# Patient Record
Sex: Male | Born: 1970 | Race: White | Hispanic: No | Marital: Single | Smoking: Current every day smoker
Health system: Southern US, Community
[De-identification: ages and names within clinical notes are randomized; demographics above are authoritative.]

## PROBLEM LIST (undated history)

## (undated) DIAGNOSIS — I10 Essential (primary) hypertension: Secondary | ICD-10-CM

## (undated) DIAGNOSIS — E785 Hyperlipidemia, unspecified: Secondary | ICD-10-CM

## (undated) HISTORY — DX: Hyperlipidemia, unspecified: E78.5

## (undated) HISTORY — DX: Essential (primary) hypertension: I10

---

## 2004-11-19 ENCOUNTER — Emergency Department (HOSPITAL_COMMUNITY): Admission: EM | Admit: 2004-11-19 | Discharge: 2004-11-19 | Payer: Self-pay | Admitting: Emergency Medicine

## 2007-01-12 ENCOUNTER — Encounter: Admission: RE | Admit: 2007-01-12 | Discharge: 2007-01-12 | Payer: Self-pay | Admitting: Family Medicine

## 2007-10-04 ENCOUNTER — Ambulatory Visit (HOSPITAL_BASED_OUTPATIENT_CLINIC_OR_DEPARTMENT_OTHER): Admission: RE | Admit: 2007-10-04 | Discharge: 2007-10-04 | Payer: Self-pay | Admitting: Urology

## 2007-10-04 ENCOUNTER — Encounter (INDEPENDENT_AMBULATORY_CARE_PROVIDER_SITE_OTHER): Payer: Self-pay | Admitting: Urology

## 2008-06-03 ENCOUNTER — Emergency Department (HOSPITAL_COMMUNITY): Admission: EM | Admit: 2008-06-03 | Discharge: 2008-06-03 | Payer: Self-pay | Admitting: Emergency Medicine

## 2010-09-27 ENCOUNTER — Ambulatory Visit (HOSPITAL_COMMUNITY)
Admission: RE | Admit: 2010-09-27 | Discharge: 2010-09-27 | Payer: Self-pay | Source: Home / Self Care | Admitting: Specialist

## 2011-04-15 NOTE — Op Note (Signed)
NAME:  Jeremy Hutchinson, Jeremy Hutchinson               ACCOUNT NO.:  [id number]   MEDICAL RECORD NO.:  [id number]          PATIENT TYPE:  AMB   LOCATION:  [facility]                         FACILITY:  [facility]   PHYSICIAN:  Maretta Bees. Vonita Moss, M.D.DATE OF BIRTH:  1970-12-31   DATE OF PROCEDURE:  10/04/2007  DATE OF DISCHARGE:                               OPERATIVE REPORT   PREOPERATIVE DIAGNOSIS:  Condyloma of penis, scrotum and thighs.   POSTOPERATIVE DIAGNOSIS:  Condyloma of penis, scrotum and thighs.   PROCEDURE:  Carbon dioxide laser of warts on penis, scrotum, and inner  thighs.   SURGEON:  Maretta Bees. Vonita Moss, M.D.   ANESTHESIA:  General.   INDICATIONS:  This 40 year old gentleman sent for evaluation for  treatment of multiple penile and scrotal and thigh lesions that are  typical of condyloma.  He was counseled about therapies including laser  therapy which I felt had the most likely chance of a long-term cure.  He  was advised about postop pain and lesions that will need to be treated  with Neosporin until they heal up.   PROCEDURE:  The patient was brought to the operating room and placed in  the lithotomy position.  External genitalia and inner thighs were  prepped with Hibiclens.  Wet towels were placed around the perioperative  area to protect the surrounding area from the laser beam.  The usual  laser precautions were utilized.  The carbon dioxide laser was set at 5  watts of power, and multiple penile warts, especially on the distal  dorsum of the penis, were treated with the CO2 laser.  A small scrotal  wart was treated, a suprapubic wart was treated, and one lesion on each  inner thigh was actually excised by using the CO2 laser aimed at the  base of the lesion.  At this point, all visible condyloma were treated,  and the wounds were covered with Neosporin.   He was taken to the recovery room in good condition.      Maretta Bees. Vonita Moss, M.D.  Electronically Signed     LJP/MEDQ  D:   10/04/2007  T:  10/04/2007  Job:  [id number]   cc:   Marjory Lies, M.D.  Fax: [phone]

## 2011-09-09 LAB — POCT I-STAT 4, (NA,K, GLUC, HGB,HCT)
Hemoglobin: 15
Operator id: 133231
Sodium: 138

## 2012-07-05 IMAGING — CR DG ORBITS FOR FOREIGN BODY
2 series · 2 of 2 positions shown · non-contrast
Comparison: None

CLINICAL DATA: Metal working/exposure; clearance prior to MRI

ORBITS FOR FOREIGN BODY - 2 VIEW

[view not recorded (1 of 2)]
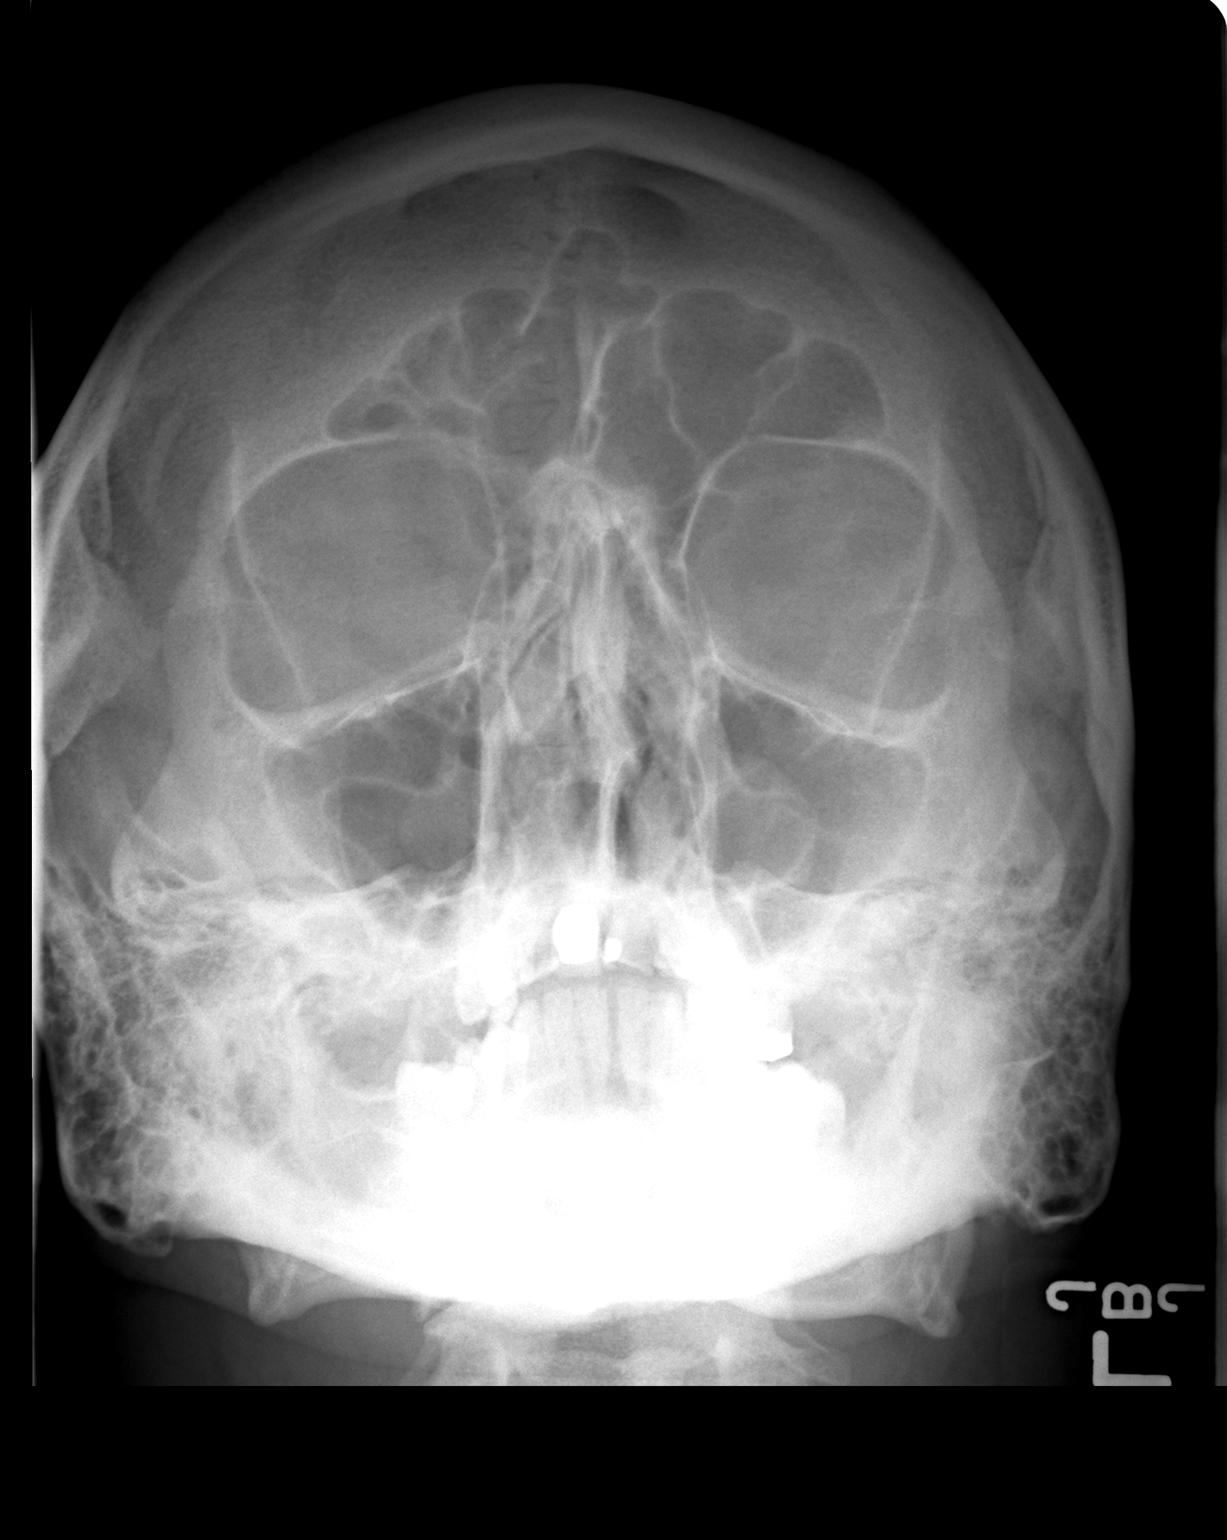

[view not recorded (2 of 2)]
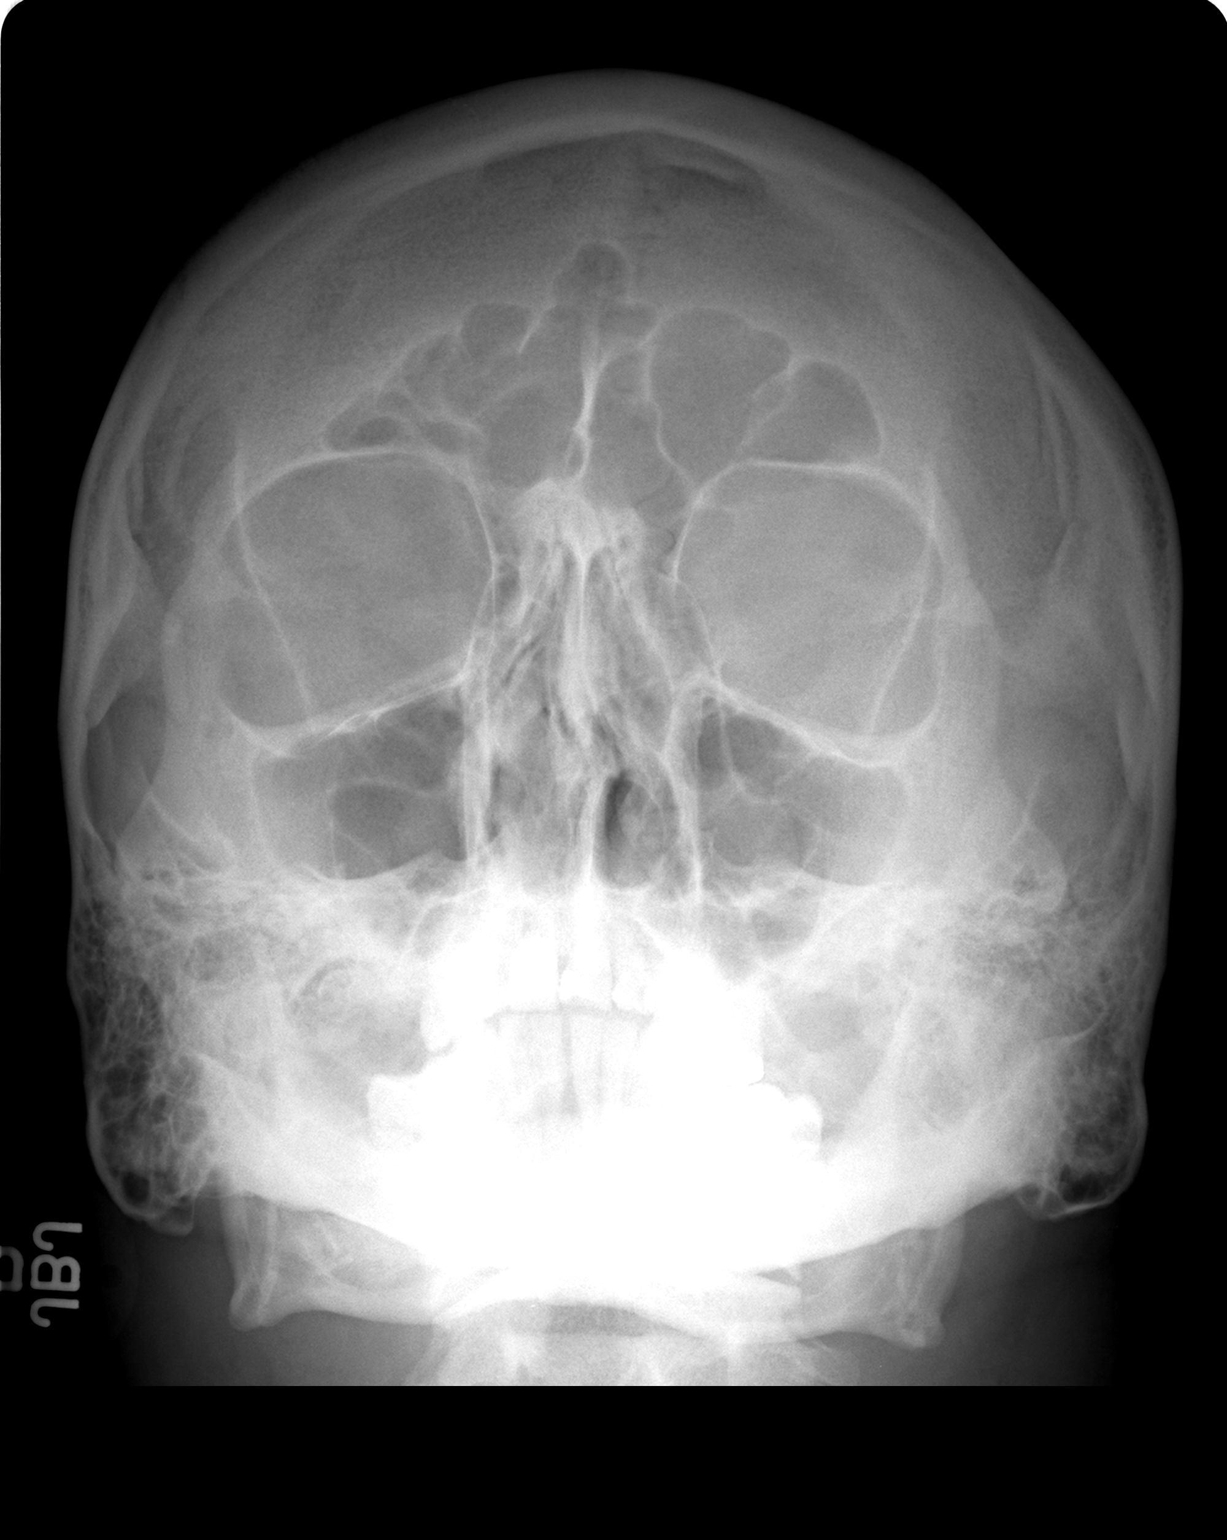

[2 of 2 positions shown; findings below may reference images not displayed]

FINDINGS: There is no evidence of metallic foreign body within the
orbits.  No significant bone abnormality identified.
IMPRESSION: No evidence of metallic foreign body within the orbits.

## 2016-10-08 ENCOUNTER — Encounter (HOSPITAL_COMMUNITY): Payer: Self-pay | Admitting: Family Medicine

## 2016-10-08 ENCOUNTER — Other Ambulatory Visit (HOSPITAL_COMMUNITY): Payer: Self-pay | Admitting: Family Medicine

## 2016-10-08 ENCOUNTER — Ambulatory Visit (HOSPITAL_COMMUNITY)
Admission: RE | Admit: 2016-10-08 | Discharge: 2016-10-08 | Disposition: A | Discharge status: Home / Self Care | Payer: 59 | Source: Ambulatory Visit | Attending: Nurse Practitioner | Admitting: Nurse Practitioner

## 2016-10-08 DIAGNOSIS — M79661 Pain in right lower leg: Secondary | ICD-10-CM | POA: Diagnosis present

## 2016-10-08 DIAGNOSIS — M7989 Other specified soft tissue disorders: Secondary | ICD-10-CM | POA: Diagnosis not present

## 2016-10-08 NOTE — Progress Notes (Signed)
VASCULAR LAB PRELIMINARY  PRELIMINARY  PRELIMINARY  PRELIMINARY  Right lower extremity venous duplex completed.    Preliminary report:  Right:  No evidence of DVT, superficial thrombosis, or Baker's cyst.  Ondrea Dow, RVS 10/08/2016, 2:27 PM

## 2019-09-22 ENCOUNTER — Ambulatory Visit (INDEPENDENT_AMBULATORY_CARE_PROVIDER_SITE_OTHER): Payer: 59 | Admitting: Cardiology

## 2019-09-22 ENCOUNTER — Encounter: Payer: Self-pay | Admitting: Cardiology

## 2019-09-22 ENCOUNTER — Other Ambulatory Visit: Payer: Self-pay

## 2019-09-22 VITALS — BP 136/90 | HR 75 | Temp 96.9°F | Ht 71.0 in | Wt 283.2 lb

## 2019-09-22 DIAGNOSIS — F17200 Nicotine dependence, unspecified, uncomplicated: Secondary | ICD-10-CM

## 2019-09-22 DIAGNOSIS — R739 Hyperglycemia, unspecified: Secondary | ICD-10-CM

## 2019-09-22 DIAGNOSIS — I10 Essential (primary) hypertension: Secondary | ICD-10-CM

## 2019-09-22 DIAGNOSIS — E782 Mixed hyperlipidemia: Secondary | ICD-10-CM

## 2019-09-22 DIAGNOSIS — R002 Palpitations: Secondary | ICD-10-CM | POA: Diagnosis not present

## 2019-09-22 MED ORDER — DILTIAZEM HCL ER COATED BEADS 240 MG PO CP24: 240.0000 mg | ORAL_CAPSULE | Freq: Every day | ORAL | 2 refills | Status: DC

## 2019-09-22 NOTE — Progress Notes (Signed)
Primary Physician/Referring:  Aletha Halim., PA-C  Patient ID: Jeremy Hutchinson, male    DOB: 01/12/1971, 48 y.o.   MRN: [id number]  Chief Complaint  Patient presents with  . New Patient (Initial Visit)  . Palpitations   HPI:    Jeremy Hutchinson  is a 48 y.o. Caucasian male patient referred to me for evaluation of palpitations, that started about 2 months ago.  Described as very brief, happening mostly during routine activities.  No other associated symptoms sometimes symptoms feel like my chest pain again lasting seconds.  Jeremy Hutchinson continues to exercise regularly both on the treadmill and weightlifting without any limitations.  Past medical history significant for hypertension, hyperlipidemia and tobacco use disorder, smokes 1 ppd.   Past Medical History:  Diagnosis Date  . Hyperlipidemia   . Hypertension    Past Surgical History:  Procedure Laterality Date  . ANKLE SURGERY     7-8 yrs ago   Social History   Socioeconomic History  . Marital status: Single    Spouse name: Not on file  . Number of children: Not on file  . Years of education: Not on file  . Highest education level: Not on file  Occupational History  . Not on file  Social Needs  . Financial resource strain: Not on file  . Food insecurity    Worry: Not on file    Inability: Not on file  . Transportation needs    Medical: Not on file    Non-medical: Not on file  Tobacco Use  . Smoking status: Current Every Day Smoker    Packs/day: 1.00    Types: Cigarettes  . Smokeless tobacco: Never Used  Substance and Sexual Activity  . Alcohol use: Yes    Comment: beer weekend  . Drug use: Not on file  . Sexual activity: Not on file  Lifestyle  . Physical activity    Days per week: Not on file    Minutes per session: Not on file  . Stress: Not on file  Relationships  . Social Herbalist on phone: Not on file    Gets together: Not on file    Attends religious service: Not on file    Active member of  club or organization: Not on file    Attends meetings of clubs or organizations: Not on file    Relationship status: Not on file  . Intimate partner violence    Fear of current or ex partner: Not on file    Emotionally abused: Not on file    Physically abused: Not on file    Forced sexual activity: Not on file  Other Topics Concern  . Not on file  Social History Narrative  . Not on file   ROS  Review of Systems  Constitution: Negative for chills, decreased appetite, malaise/fatigue and weight gain.  Cardiovascular: Positive for irregular heartbeat and palpitations. Negative for dyspnea on exertion, leg swelling and syncope.  Respiratory: Negative for snoring.   Endocrine: Negative for cold intolerance.  Hematologic/Lymphatic: Does not bruise/bleed easily.  Musculoskeletal: Negative for joint swelling.  Gastrointestinal: Negative for abdominal pain, anorexia, change in bowel habit, hematochezia and melena.  Neurological: Negative for headaches and light-headedness.  Psychiatric/Behavioral: Negative for depression and substance abuse.  All other systems reviewed and are negative.  Objective   Vitals with BMI 09/22/2019  Height 5\' 11"   Weight 283 lbs 3 oz  BMI 97.98  Systolic 921  Diastolic 90  Pulse  75    Blood pressure 136/90, pulse 75, temperature (!) 96.9 F (36.1 C), height 5\' 11"  (1.803 m), weight 283 lb 3.2 oz (128.5 kg), SpO2 97 %. Body mass index is 39.5 kg/m.   Physical Exam  Constitutional:  Well built and nearly morbidly obese in no acute distress  HENT:  Head: Atraumatic.  Eyes: Conjunctivae are normal.  Neck: Neck supple. No JVD present. No thyromegaly present.  Cardiovascular: Normal rate, regular rhythm, normal heart sounds and intact distal pulses. Exam reveals no gallop.  No murmur heard. No leg edema, no JVD.  Pulmonary/Chest: Effort normal and breath sounds normal.  Abdominal: Soft. Bowel sounds are normal.  Obese  Musculoskeletal: Normal range of  motion.  Neurological: Jeremy Hutchinson is alert.  Skin: Skin is warm and dry.  Psychiatric: Jeremy Hutchinson has a normal mood and affect.    Laboratory examination:   Hemoglobin A1CResulted: 08/03/2019 6:44 PM [facility] Component Name Value Ref Range  HEMOGLOBIN A1C 5.7 (H)  Comment: Normal:    Less than 5.7% Prediabetes: 5.7% to 6.4% Diabetes:   Greater than 6.4% <5.7 %  eAG 117 MG/DL  Lipid ProfileResulted: 08/03/2019 6:22 PM [facility] Component Name Value Ref Range  LDL Direct 152 (H) <130 mg/dL  Total Cholesterol 409229 (H) 25 - 199 MG/DL  Triglycerides 811574 (H) 10 - 150 MG/DL  HDL Cholesterol 37 35 - 135 MG/DL  Total Chol / HDL Cholesterol 6.2 (H) <4.5   Non-HDL Cholesterol 192  Comment:  TARGET: <(LDL-C TARGET + 30)MG/DL MG/DL    Comprehensive Metabolic PanelResulted: 08/03/2019 6:22 PM [facility] Component Name Value Ref Range  Sodium 137 135 - 146 MMOL/L  Potassium 4.0 3.5 - 5.3 MMOL/L  Chloride 104 98 - 110 MMOL/L  CO2 26 23 - 30 MMOL/L  BUN 11 8 - 24 MG/DL  Glucose 97 70 - 99 MG/DL  Creatinine 9.140.80 0.5 - 1.5 MG/DL  Calcium 9.5 8.5 - 78.210.5 MG/DL  Total Protein 7.0 6 - 8.3 G/DL  Albumin  4.4 3.5 - 5 G/DL  Total Bilirubin 0.4 0.1 - 1.2 MG/DL  Alkaline Phosphatase 47 25 - 125 IU/L or U/L  AST (SGOT) 23 5 - 40 IU/L or U/L  ALT (SGPT) 41 5 - 50 IU/L or U/L  Anion Gap 8 4 - 14 MMOL/L   CBC normal.   No results for input(s): NA, K, CL, CO2, GLUCOSE, BUN, CREATININE, CALCIUM, GFRNONAA, GFRAA in the last 8760 hours. CMP 10/04/2007  Glucose 114(H)  Sodium 138  Potassium 3.9   CBC 10/04/2007  Hemoglobin 15.0  Hematocrit 44.0   Lipid Panel  No results found for: CHOL, TRIG, HDL, CHOLHDL, VLDL, LDLCALC, LDLDIRECT HEMOGLOBIN A1C No results found for: HGBA1C, MPG TSH No results for input(s): TSH in the last 8760 hours. Medications and allergies  No Known Allergies   Prior to Admission medications   Medication  Sig Start Date End Date Taking? Authorizing Provider  atorvastatin (LIPITOR) 40 MG tablet Take by mouth daily. 08/03/19  Yes [provider]  Cholecalciferol (VITAMIN D3) 125 MCG (5000 UT) TABS Take by mouth daily.   Yes [provider]  lisinopril-hydrochlorothiazide (ZESTORETIC) 20-25 MG tablet Take by mouth daily. 09/08/19  Yes [provider]  Omega-3 Fatty Acids (OMEGA-3 EPA FISH OIL PO) Take by mouth. 1500-300mg  bid   Yes [provider]     Current Outpatient Medications  Medication Instructions  . atorvastatin (LIPITOR) 40 MG tablet Oral, Daily  .  Cholecalciferol (VITAMIN D3) 125 MCG (5000 UT) TABS Oral, Daily  . diltiazem (CARDIZEM CD) 240 mg, Oral, Daily  . lisinopril-hydrochlorothiazide (ZESTORETIC) 20-25 MG tablet Oral, Daily  . Omega-3 Fatty Acids (OMEGA-3 EPA FISH OIL PO) Oral, 1500-300mg  bid     Radiology:  No results found.   Cardiac Studies:   None  Assessment     ICD-10-CM   1. Palpitations  R00.2 EKG 12-Lead    PCV CARDIAC STRESS TEST    PCV ECHOCARDIOGRAM COMPLETE  2. Essential hypertension  I10 PCV CARDIAC STRESS TEST    PCV ECHOCARDIOGRAM COMPLETE  3. Mixed hyperlipidemia  E78.2   4. Hyperglycemia  R73.9   5. Tobacco use disorder  F17.200     EKG 09/22/2019: Normal sinus rhythm at rate of 71 bpm, normal axis.  No evidence of ischemia, normal EKG.  Recommendations:    Mrs. Jamar is a Caucasian male with hypertension, mixed hyperlipidemia, hyperglycemia, obesity, who still exercises fairly rigorously, referred to me for evaluation of palpitations that started about 2 months ago and also has sharp chest pains associated with this.  Mostly noticed symptoms during rest and routine activities done with exertional activities.  Symptoms are more indicated to of PACs and PVCs.  Do not think that Jeremy Hutchinson has atrial fibrillation although Jeremy Hutchinson is at risk for the same.  Jeremy Hutchinson blood pressure is elevated today, also in view of palpitations, I  have added diltiazem CD 140 mg q. daily.  I reviewed Jeremy Hutchinson medical records, had a long discussion with him regarding hyper lipidemia especially mixed hyperlipidemia and Jeremy Hutchinson poor eating habits.  Although Jeremy Hutchinson states that Jeremy Hutchinson eats healthy only since Jeremy Hutchinson recently saw Jeremy Hutchinson PCP and was found to have marked elevation in lipids and hypertension has made changes to Jeremy Hutchinson diet.  Jeremy Hutchinson was on statin before but Jeremy Hutchinson had discontinued this and Jeremy Hutchinson is now back on it.  I did not make any changes to this.  I'll set him up for a routine treadmill excess stress test and an echocardiogram and see him back after this. I reviewed the labs from PCP.  Jeremy Hutchinson has one pack of cigarette smoking a day, I have discussed regarding smoking cessation.  Yates Decamp, MD, [facility] 09/24/2019, 6:07 PM [facility]. [place] Pager: [phone] Office: [phone] If no answer Cell [phone]

## 2019-09-30 ENCOUNTER — Other Ambulatory Visit: Payer: Self-pay

## 2019-09-30 ENCOUNTER — Ambulatory Visit (INDEPENDENT_AMBULATORY_CARE_PROVIDER_SITE_OTHER): Payer: 59

## 2019-09-30 DIAGNOSIS — I10 Essential (primary) hypertension: Secondary | ICD-10-CM | POA: Diagnosis not present

## 2019-09-30 DIAGNOSIS — R002 Palpitations: Secondary | ICD-10-CM

## 2019-10-07 ENCOUNTER — Telehealth: Payer: Self-pay

## 2019-10-07 NOTE — Telephone Encounter (Signed)
Pt wants to know if you can give him a call to discuss his echo results; He doesn't want to wait until a follow up appt  [phone]

## 2019-10-07 NOTE — Telephone Encounter (Signed)
Essentially normal echo. The aortic root (aorta is the main artery that connect the heart and rest of the body and carries blod) is minimally dilated. Minor abnormality

## 2019-10-13 NOTE — Telephone Encounter (Signed)
Pt is fine with results wants more info and will wait until appt

## 2019-11-02 ENCOUNTER — Other Ambulatory Visit: Payer: Self-pay

## 2019-11-02 ENCOUNTER — Ambulatory Visit (INDEPENDENT_AMBULATORY_CARE_PROVIDER_SITE_OTHER): Payer: 59

## 2019-11-02 DIAGNOSIS — R002 Palpitations: Secondary | ICD-10-CM | POA: Diagnosis not present

## 2019-11-02 DIAGNOSIS — R079 Chest pain, unspecified: Secondary | ICD-10-CM | POA: Diagnosis not present

## 2019-11-02 DIAGNOSIS — I10 Essential (primary) hypertension: Secondary | ICD-10-CM

## 2019-11-07 ENCOUNTER — Other Ambulatory Visit: Payer: Self-pay

## 2019-11-07 ENCOUNTER — Ambulatory Visit (INDEPENDENT_AMBULATORY_CARE_PROVIDER_SITE_OTHER): Payer: 59 | Admitting: Cardiology

## 2019-11-07 ENCOUNTER — Encounter: Payer: Self-pay | Admitting: Cardiology

## 2019-11-07 VITALS — BP 142/85 | HR 85 | Temp 98.0°F | Ht 71.0 in | Wt 286.0 lb

## 2019-11-07 DIAGNOSIS — F17200 Nicotine dependence, unspecified, uncomplicated: Secondary | ICD-10-CM

## 2019-11-07 DIAGNOSIS — R002 Palpitations: Secondary | ICD-10-CM | POA: Diagnosis not present

## 2019-11-07 DIAGNOSIS — E782 Mixed hyperlipidemia: Secondary | ICD-10-CM

## 2019-11-07 DIAGNOSIS — I77810 Thoracic aortic ectasia: Secondary | ICD-10-CM | POA: Diagnosis not present

## 2019-11-07 DIAGNOSIS — I10 Essential (primary) hypertension: Secondary | ICD-10-CM

## 2019-11-07 MED ORDER — DILTIAZEM HCL ER COATED BEADS 240 MG PO CP24: 240.0000 mg | ORAL_CAPSULE | Freq: Every day | ORAL | 3 refills | Status: DC

## 2019-11-07 MED ORDER — LOSARTAN POTASSIUM-HCTZ 100-25 MG PO TABS: 1.0000 | ORAL_TABLET | Freq: Every day | ORAL | 1 refills | Status: DC

## 2019-11-07 NOTE — Progress Notes (Signed)
Primary Physician/Referring:  Richmond CampbellKaplan, Kristen W., PA-C  Patient ID: Jeremy Hutchinson, male    DOB: Nov 22, 1971, 48 y.o.   MRN: [id number]  Chief Complaint  Patient presents with  . Palpitations  . Follow-up    6 week  . Results    stress and echo   HPI:    Jeremy BailRichard L Connon  is a 48 y.o. Caucasian male patient referred to me for evaluation of palpitations, that started about 2 months ago.  Described as very brief, happening mostly during routine activities. He continues to exercise regularly both on the treadmill and weightlifting without any limitations.    Past medical history significant for hypertension, hyperlipidemia and tobacco use disorder, smokes 1 ppd. He also has had occasional sharp chest pain. He underwent Lexiscan sestamibi stress test as I could not go treadmill stress testing in view of Coumadin 19 and also an echocardiogram and presents for follow-up.  He has noticed marked improvement in symptoms of palpitations since being on diltiazem.  Otherwise no new symptoms.  Past Medical History:  Diagnosis Date  . Hyperlipidemia   . Hypertension    Past Surgical History:  Procedure Laterality Date  . ANKLE SURGERY     7-8 yrs ago   Social History   Socioeconomic History  . Marital status: Single    Spouse name: Not on file  . Number of children: Not on file  . Years of education: Not on file  . Highest education level: Not on file  Occupational History  . Not on file  Social Needs  . Financial resource strain: Not on file  . Food insecurity    Worry: Not on file    Inability: Not on file  . Transportation needs    Medical: Not on file    Non-medical: Not on file  Tobacco Use  . Smoking status: Current Every Day Smoker    Packs/day: 1.00    Types: Cigarettes  . Smokeless tobacco: Never Used  Substance and Sexual Activity  . Alcohol use: Yes    Comment: beer weekend  . Drug use: Not on file  . Sexual activity: Not on file  Lifestyle  . Physical activity     Days per week: Not on file    Minutes per session: Not on file  . Stress: Not on file  Relationships  . Social Musicianconnections    Talks on phone: Not on file    Gets together: Not on file    Attends religious service: Not on file    Active member of club or organization: Not on file    Attends meetings of clubs or organizations: Not on file    Relationship status: Not on file  . Intimate partner violence    Fear of current or ex partner: Not on file    Emotionally abused: Not on file    Physically abused: Not on file    Forced sexual activity: Not on file  Other Topics Concern  . Not on file  Social History Narrative  . Not on file   ROS  Review of Systems  Constitution: Negative for chills, decreased appetite, malaise/fatigue and weight gain.  Cardiovascular: Negative for dyspnea on exertion, irregular heartbeat, leg swelling, palpitations and syncope.  Respiratory: Negative for snoring.   Endocrine: Negative for cold intolerance.  Hematologic/Lymphatic: Does not bruise/bleed easily.  Musculoskeletal: Negative for joint swelling.  Gastrointestinal: Negative for abdominal pain, anorexia, change in bowel habit, hematochezia and melena.  Neurological: Negative for headaches  and light-headedness.  Psychiatric/Behavioral: Negative for depression and substance abuse.  All other systems reviewed and are negative.  Objective   Vitals with BMI 11/07/2019 09/22/2019  Height 5\' 11"  5\' 11"   Weight 286 lbs 283 lbs 3 oz  BMI 39.91 39.52  Systolic 142 136  Diastolic 85 90  Pulse 85 75    Blood pressure (!) 142/85, pulse 85, temperature 98 F (36.7 C), height 5\' 11"  (1.803 m), weight 286 lb (129.7 kg), SpO2 98 %. Body mass index is 39.89 kg/m.   Physical Exam  Constitutional:  Well built and nearly morbidly obese in no acute distress  HENT:  Head: Atraumatic.  Eyes: Conjunctivae are normal.  Neck: Neck supple. No JVD present. No thyromegaly present.  Cardiovascular: Normal rate,  regular rhythm, normal heart sounds and intact distal pulses. Exam reveals no gallop.  No murmur heard. No leg edema, no JVD.  Pulmonary/Chest: Effort normal and breath sounds normal.  Abdominal: Soft. Bowel sounds are normal.  Obese  Musculoskeletal: Normal range of motion.  Neurological: He is alert.  Skin: Skin is warm and dry.  Psychiatric: He has a normal mood and affect.    Laboratory examination:   Hemoglobin A1CResulted: 08/03/2019 6:44 PM [facility] Component Name Value Ref Range  HEMOGLOBIN A1C 5.7 (H)  Comment: Normal:    Less than 5.7% Prediabetes: 5.7% to 6.4% Diabetes:   Greater than 6.4% <5.7 %  eAG 117 MG/DL  Lipid ProfileResulted: 08/03/2019 6:22 PM [facility] Component Name Value Ref Range  LDL Direct 152 (H) <130 mg/dL  Total Cholesterol [facility] (H) 25 - 199 MG/DL  Triglycerides 10/03/2019 (H) 10 - 150 MG/DL  HDL Cholesterol 37 35 - 135 MG/DL  Total Chol / HDL Cholesterol 6.2 (H) <4.5   Non-HDL Cholesterol 192  Comment:  TARGET: <(LDL-C TARGET + 30)MG/DL MG/DL    Comprehensive Metabolic PanelResulted: 08/03/2019 6:22 PM [facility] Component Name Value Ref Range  Sodium 137 135 - 146 MMOL/L  Potassium 4.0 3.5 - 5.3 MMOL/L  Chloride 104 98 - 110 MMOL/L  CO2 26 23 - 30 MMOL/L  BUN 11 8 - 24 MG/DL  Glucose 97 70 - 99 MG/DL  Creatinine 778 0.5 - 1.5 MG/DL  Calcium 9.5 8.5 - 10/03/2019 MG/DL  Total Protein 7.0 6 - 8.3 G/DL  Albumin  4.4 3.5 - 5 G/DL  Total Bilirubin 0.4 0.1 - 1.2 MG/DL  Alkaline Phosphatase 47 25 - 125 IU/L or U/L  AST (SGOT) 23 5 - 40 IU/L or U/L  ALT (SGPT) 41 5 - 50 IU/L or U/L  Anion Gap 8 4 - 14 MMOL/L   CBC normal.  No results for input(s): NA, K, CL, CO2, GLUCOSE, BUN, CREATININE, CALCIUM, GFRNONAA, GFRAA in the last 8760 hours. CMP 10/04/2007  Glucose 114(H)  Sodium 138  Potassium 3.9   CBC 10/04/2007  Hemoglobin 15.0  Hematocrit 44.0   Lipid Panel  No results  found for: CHOL, TRIG, HDL, CHOLHDL, VLDL, LDLCALC, LDLDIRECT HEMOGLOBIN A1C No results found for: HGBA1C, MPG TSH No results for input(s): TSH in the last 8760 hours. Medications and allergies  No Known Allergies   Prior to Admission medications   Medication Sig Start Date End Date Taking? Authorizing Provider  atorvastatin (LIPITOR) 40 MG tablet Take by mouth daily. 08/03/19  Yes [provider]  Cholecalciferol (VITAMIN D3) 125 MCG (5000 UT) TABS Take by mouth daily.   Yes [provider]  lisinopril-hydrochlorothiazide (  ZESTORETIC) 20-25 MG tablet Take by mouth daily. 09/08/19  Yes [provider]  Omega-3 Fatty Acids (OMEGA-3 EPA FISH OIL PO) Take by mouth. 1500-300mg  bid   Yes [provider]     Current Outpatient Medications  Medication Instructions  . atorvastatin (LIPITOR) 40 MG tablet Oral, Daily  . Cholecalciferol (VITAMIN D3) 125 MCG (5000 UT) TABS Oral, Daily  . diltiazem (CARDIZEM CD) 240 mg, Oral, Daily  . losartan-hydrochlorothiazide (HYZAAR) 100-25 MG tablet 1 tablet, Oral, Daily  . Omega-3 Fatty Acids (OMEGA-3 EPA FISH OIL PO) Oral, 1500-300mg  bid    Radiology:  No results found.  Cardiac Studies:   Echocardiogram 09/30/2019: Left ventricle cavity is normal in size. Mild concentric hypertrophy of the left ventricle. Normal LV systolic function with visual EF 50-55%. Normal global wall motion. Indeterminate diastolic filling pattern. The aortic root is upper normal at 3.9 cm. Left atrial cavity is mildly dilated. Trac mitral regurgitation. IVC is dilated with respiratory variation. Estimated RA pressure 8 mmHg.  Lexiscan Sestamibi stress test 11/02/2019: No previous exam available for comparison. Lexiscan nuclear stress test performed using 1-day protocol. Myocardial perfusion imaging is normal. Left ventricular ejection fraction is  78% with normal wall motion. Low risk study.  Assessment     ICD-10-CM   1. Palpitations   R00.2 diltiazem (CARDIZEM CD) 240 MG 24 hr capsule  2. Essential hypertension  I10 losartan-hydrochlorothiazide (HYZAAR) 100-25 MG tablet    diltiazem (CARDIZEM CD) 240 MG 24 hr capsule    PCV ECHOCARDIOGRAM COMPLETE  3. Mixed hyperlipidemia  E78.2   4. Aortic root dilatation (HCC) 3.9 cm by echo 10/2019  I77.810 losartan-hydrochlorothiazide (HYZAAR) 100-25 MG tablet    PCV ECHOCARDIOGRAM COMPLETE  5. Tobacco use disorder  F17.200     EKG 09/22/2019: Normal sinus rhythm at rate of 71 bpm, normal axis.  No evidence of ischemia, normal EKG.  Recommendations:    Mrs. Pelzer is a Caucasian male with hypertension, mixed hyperlipidemia, hyperglycemia, morbid obesity, who still exercises fairly rigorously,tobacco use disorder presents for f/u of palpitations, atypical chest pain.   Symptoms are more indicated to of PACs and PVCs.  Do not think that he has atrial fibrillation although he is at risk in view of obesity and hypertension. He denies symptoms suggestive of OSA.  On his last office visit I had added diltiazem both for hypertension and palpitation which he is tolerating and has not had any further palpitations. Continue same.  With regard to hyperlipidemia, he has recently been started on a statin.  Needs follow-up lipids.  Triglyceride elevation mostly related to excess calorie intake and morbid obesity related to high calorie intake.  I reviewed recently performed echocardiogram and stress test with the patient and reassured him.   In view of slightly elevated blood pressure and also atypical dilatation, I'll discontinue lisinopril HCT and switching to losartan HCT 100/25 mg in the morning. I will see him back in 1 year for follow-up of aortic root dilatation With repeat echocardiogram.  I have discussed with him regarding risk of aortic aneurysm especially if tobacco use continues.  He appears to be motivated.  Adrian Prows, MD, [facility] 11/07/2019, 10:39 AM [facility]. [place] Pager:  [phone] Office: [phone] If no answer Cell [phone]

## 2020-05-07 ENCOUNTER — Other Ambulatory Visit: Payer: Self-pay | Admitting: Cardiology

## 2020-05-07 DIAGNOSIS — I10 Essential (primary) hypertension: Secondary | ICD-10-CM

## 2020-05-07 DIAGNOSIS — I77810 Thoracic aortic ectasia: Secondary | ICD-10-CM

## 2020-05-09 ENCOUNTER — Other Ambulatory Visit: Payer: Self-pay

## 2020-05-09 DIAGNOSIS — I77810 Thoracic aortic ectasia: Secondary | ICD-10-CM

## 2020-05-09 DIAGNOSIS — I10 Essential (primary) hypertension: Secondary | ICD-10-CM

## 2020-05-09 MED ORDER — LOSARTAN POTASSIUM-HCTZ 100-25 MG PO TABS
1.0000 | ORAL_TABLET | Freq: Every day | ORAL | 0 refills | Status: AC
Start: 2020-05-09 — End: ?

## 2020-11-02 ENCOUNTER — Other Ambulatory Visit: Payer: Self-pay | Admitting: Cardiology

## 2020-11-02 DIAGNOSIS — R002 Palpitations: Secondary | ICD-10-CM

## 2020-11-02 DIAGNOSIS — I10 Essential (primary) hypertension: Secondary | ICD-10-CM

## 2020-11-05 ENCOUNTER — Other Ambulatory Visit: Payer: 59

## 2020-11-05 ENCOUNTER — Other Ambulatory Visit: Payer: Self-pay

## 2020-11-05 DIAGNOSIS — I10 Essential (primary) hypertension: Secondary | ICD-10-CM

## 2020-11-05 NOTE — Telephone Encounter (Signed)
From pt

## 2020-11-12 ENCOUNTER — Ambulatory Visit: Payer: 59 | Admitting: Cardiology

## 2020-12-10 ENCOUNTER — Other Ambulatory Visit: Payer: 59

## 2020-12-12 ENCOUNTER — Ambulatory Visit: Payer: 59 | Admitting: Cardiology

## 2020-12-20 ENCOUNTER — Ambulatory Visit: Payer: 59 | Admitting: Cardiology

## 2021-02-02 ENCOUNTER — Other Ambulatory Visit: Payer: Self-pay | Admitting: Cardiology

## 2021-02-02 DIAGNOSIS — R002 Palpitations: Secondary | ICD-10-CM

## 2021-02-02 DIAGNOSIS — I10 Essential (primary) hypertension: Secondary | ICD-10-CM

## 2021-05-07 ENCOUNTER — Other Ambulatory Visit: Payer: Self-pay | Admitting: Cardiology

## 2021-05-07 DIAGNOSIS — R002 Palpitations: Secondary | ICD-10-CM

## 2021-05-07 DIAGNOSIS — I10 Essential (primary) hypertension: Secondary | ICD-10-CM

## 2021-05-12 ENCOUNTER — Other Ambulatory Visit: Payer: Self-pay | Admitting: Cardiology

## 2021-05-12 DIAGNOSIS — I10 Essential (primary) hypertension: Secondary | ICD-10-CM

## 2021-05-12 DIAGNOSIS — R002 Palpitations: Secondary | ICD-10-CM

## 2021-07-28 ENCOUNTER — Other Ambulatory Visit: Payer: Self-pay | Admitting: Cardiology

## 2021-07-28 DIAGNOSIS — R002 Palpitations: Secondary | ICD-10-CM

## 2021-07-28 DIAGNOSIS — I10 Essential (primary) hypertension: Secondary | ICD-10-CM

## 2021-07-30 ENCOUNTER — Other Ambulatory Visit: Payer: Self-pay | Admitting: Cardiology

## 2021-07-30 DIAGNOSIS — I10 Essential (primary) hypertension: Secondary | ICD-10-CM

## 2021-07-30 DIAGNOSIS — R002 Palpitations: Secondary | ICD-10-CM

## 2021-08-14 ENCOUNTER — Other Ambulatory Visit: Payer: Self-pay | Admitting: Cardiology

## 2021-08-14 DIAGNOSIS — I10 Essential (primary) hypertension: Secondary | ICD-10-CM

## 2021-08-14 DIAGNOSIS — R002 Palpitations: Secondary | ICD-10-CM

## 2021-10-25 ENCOUNTER — Other Ambulatory Visit: Payer: Self-pay | Admitting: Cardiology

## 2021-10-25 DIAGNOSIS — I10 Essential (primary) hypertension: Secondary | ICD-10-CM

## 2021-10-25 DIAGNOSIS — R002 Palpitations: Secondary | ICD-10-CM

## 2021-10-28 ENCOUNTER — Other Ambulatory Visit: Payer: Self-pay | Admitting: Cardiology

## 2021-10-28 DIAGNOSIS — I10 Essential (primary) hypertension: Secondary | ICD-10-CM

## 2021-10-28 DIAGNOSIS — R002 Palpitations: Secondary | ICD-10-CM
